# Patient Record
Sex: Female | Born: 1995 | Race: White | Hispanic: No | Marital: Single | Smoking: Never smoker
Health system: Southern US, Community
[De-identification: ages and names within clinical notes are randomized; demographics above are authoritative.]

## PROBLEM LIST (undated history)

## (undated) DIAGNOSIS — R55 Syncope and collapse: Secondary | ICD-10-CM

---
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
## 2016-01-22 IMAGING — CT CT ANGIO CHEST
2 of 6 series · 19 of 36 positions shown · IV contrast ([ID] [id number])
Comparison: None.

CLINICAL DATA: [DATE] syncopal episodes in past 2 days, chest pain,
history vasovagal syndrome since age 14

EXAM:
CT ANGIOGRAPHY CHEST WITH CONTRAST
TECHNIQUE: Multidetector CT imaging of the chest was performed using the
standard protocol during bolus administration of intravenous
contrast. Multiplanar CT image reconstructions and MIPs were
obtained to evaluate the vascular anatomy.
CONTRAST:  100mL OMNIPAQUE IOHEXOL 350 MG/ML SOLN

[Series 7: thins for pacs · axial · 0.54mm/px · z∈[+1434,+1630]mm · 18 of 217 slices shown]
[im 11/217  lung]
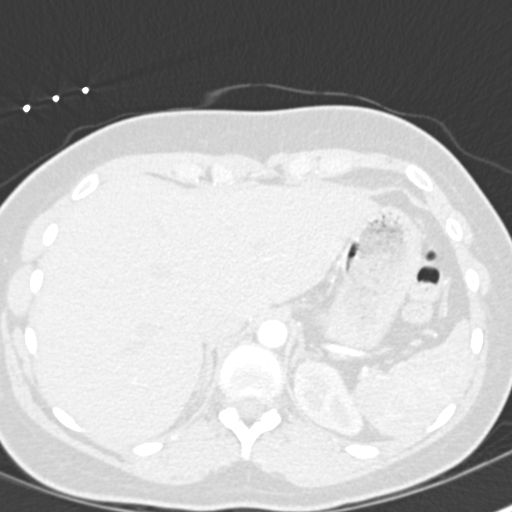
[im 22/217  mediastinal]
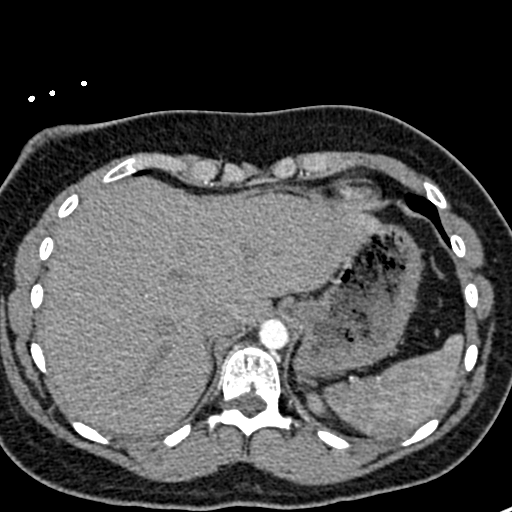
[im 33/217  lung]
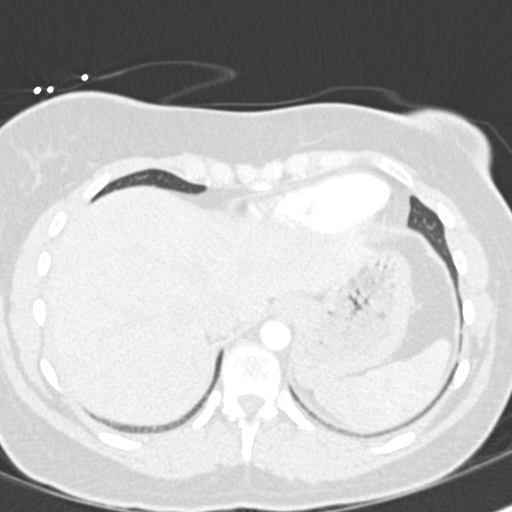
[im 44/217  mediastinal]
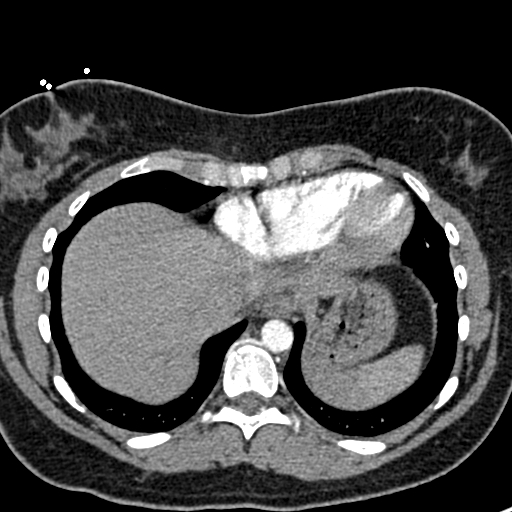
[im 55/217  lung]
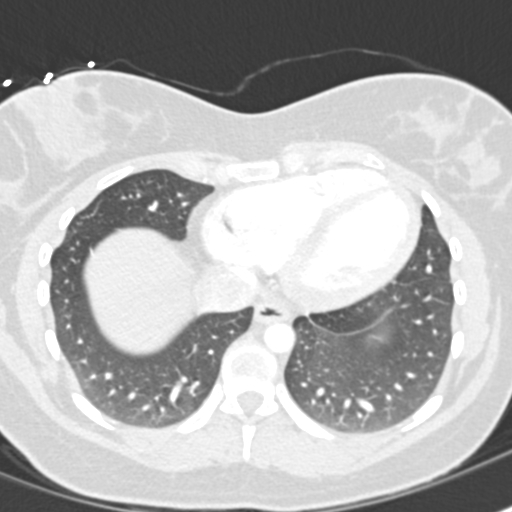
[im 65/217  mediastinal]
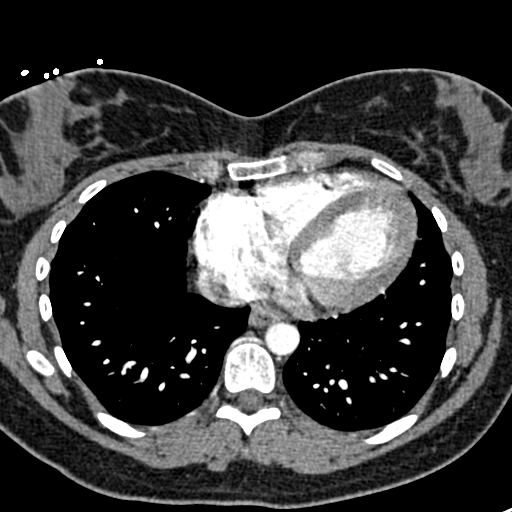
[im 76/217  lung]
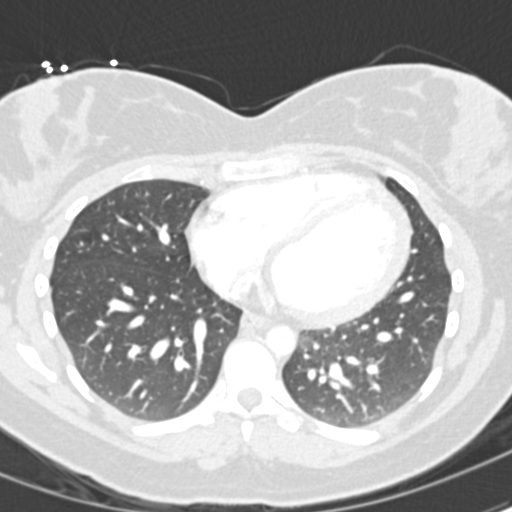
[im 87/217  mediastinal]
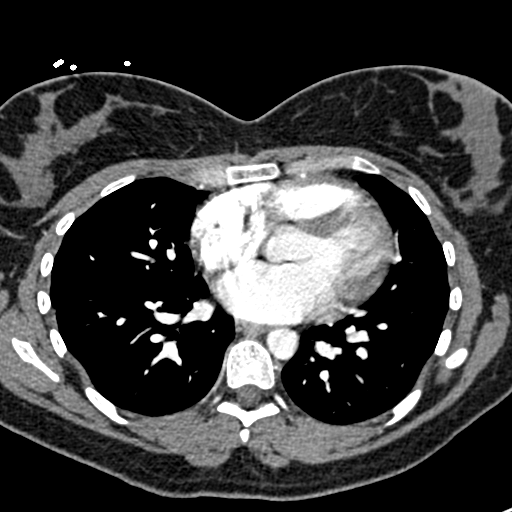
[im 98/217  lung]
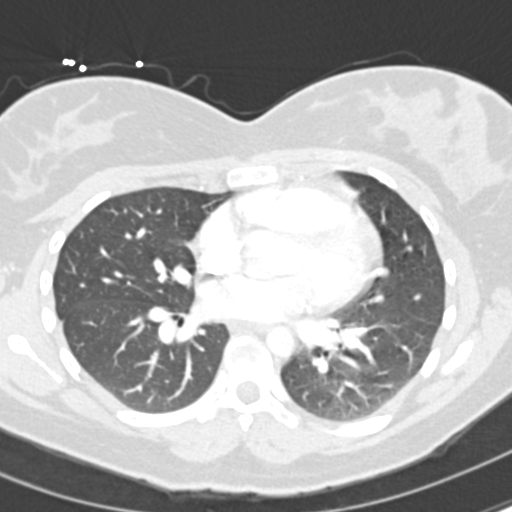
[im 119/217  mediastinal]
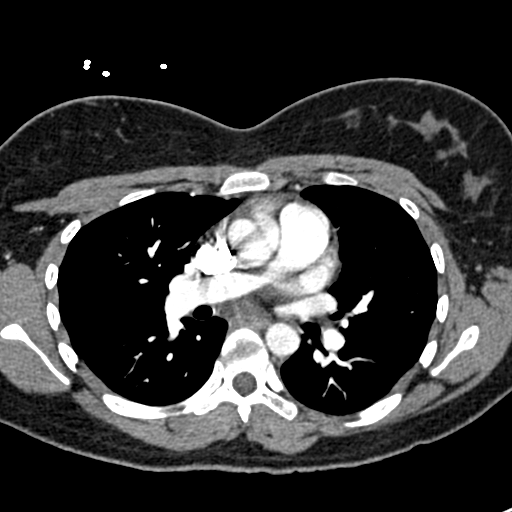
[im 130/217  lung]
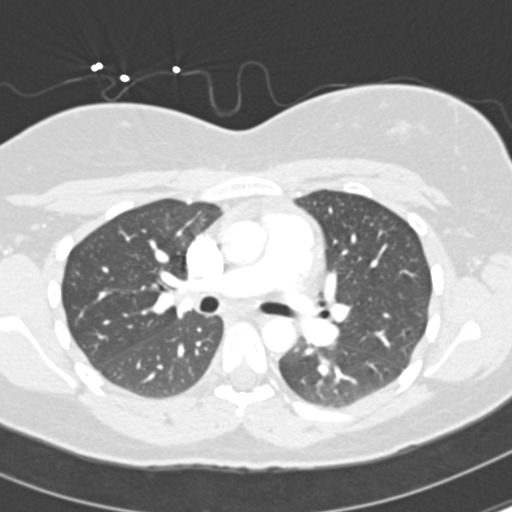
[im 141/217  mediastinal]
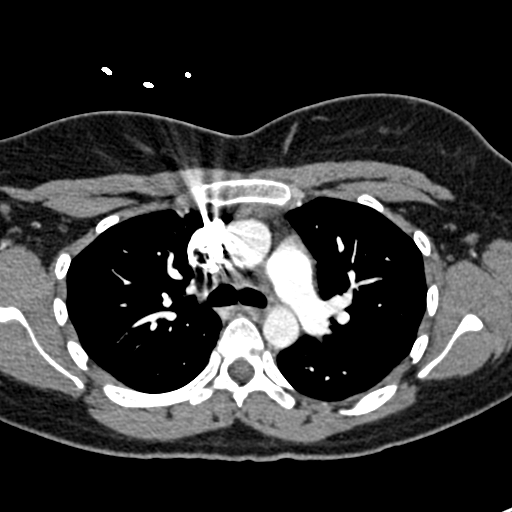
[im 152/217  lung]
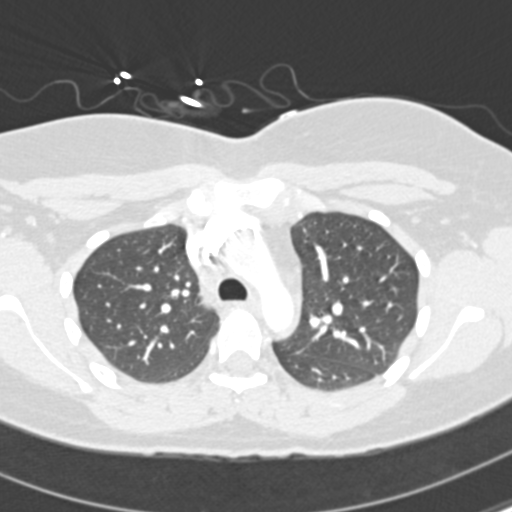
[im 163/217  mediastinal]
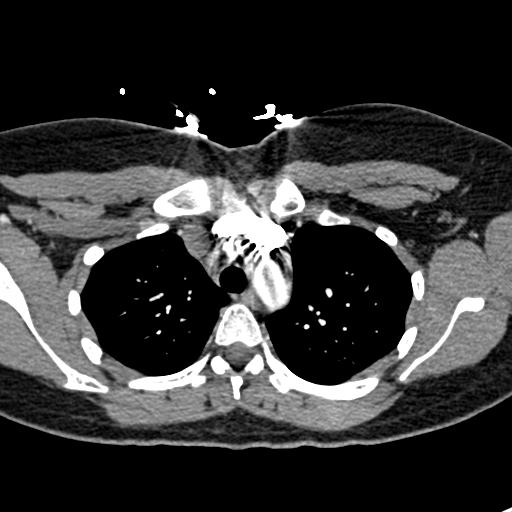
[im 173/217  lung]
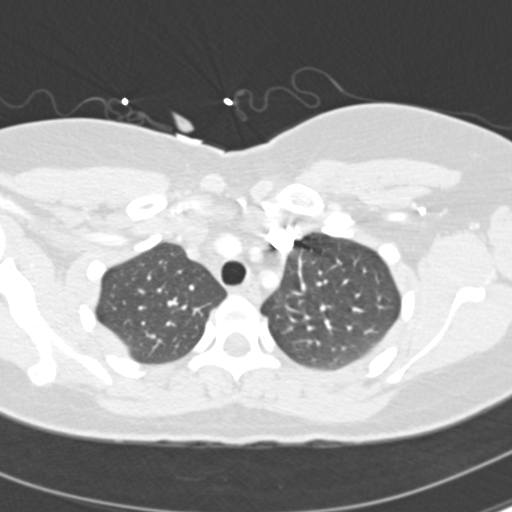
[im 184/217  mediastinal]
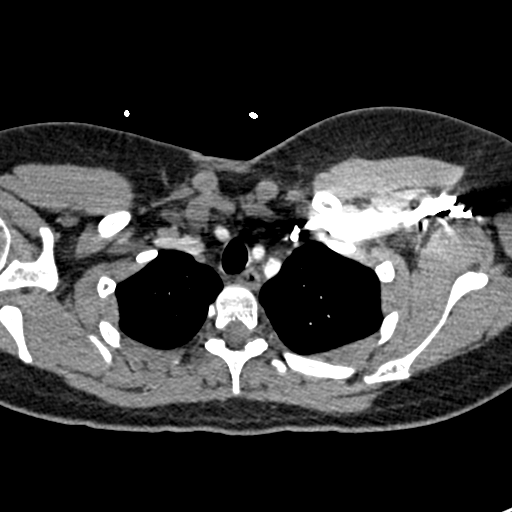
[im 195/217  lung]
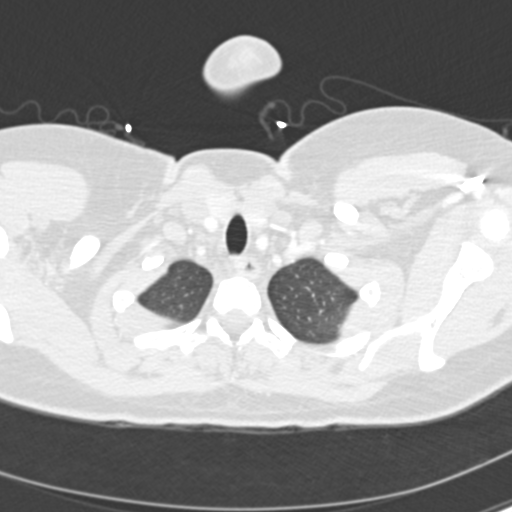
[im 206/217  mediastinal]
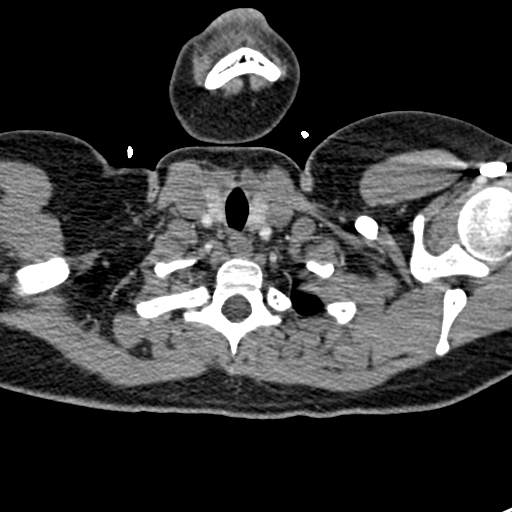

[Series 9: coronal mpr · coronal · 0.44mm/px · 1 of 93 slices shown]
[im 47/93  mediastinal]
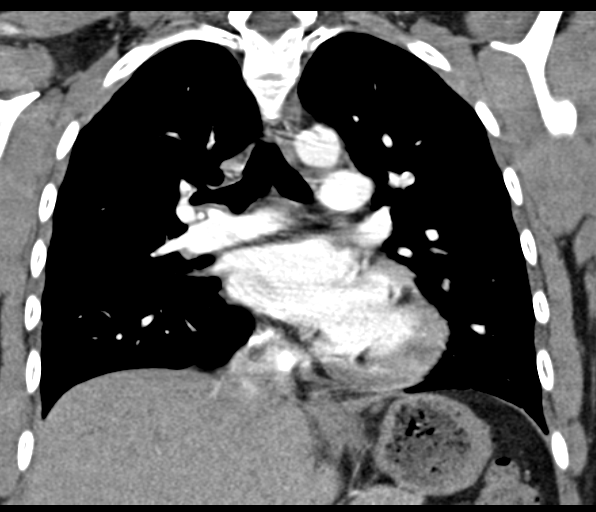

[19 of 36 positions shown; findings below may reference images not displayed]

FINDINGS: Aorta normal caliber without aneurysm or dissection.

Pulmonary arteries well opacified and patent.

No evidence of pulmonary embolism.

Visualized portion of upper abdomen normal appearance.

No thoracic adenopathy.

Lungs clear.

No pulmonary infiltrate, pleural effusion or pneumothorax.

Probable bone island anterior aspect of T9 vertebral body.

No acute osseous findings.

Review of the MIP images confirms the above findings.
IMPRESSION: Normal CTA chest.
